# Patient Record
Sex: Female | Born: 1985
Health system: Southern US, Community
[De-identification: ages and names within clinical notes are randomized; demographics above are authoritative.]

---
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
## 2011-01-23 ENCOUNTER — Emergency Department (HOSPITAL_BASED_OUTPATIENT_CLINIC_OR_DEPARTMENT_OTHER)
Admission: EM | Admit: 2011-01-23 | Discharge: 2011-01-23 | Disposition: A | Discharge status: Home / Self Care | Payer: No Typology Code available for payment source | Attending: Emergency Medicine | Admitting: Emergency Medicine

## 2011-01-23 ENCOUNTER — Encounter: Payer: Self-pay | Admitting: *Deleted

## 2011-01-23 ENCOUNTER — Emergency Department (INDEPENDENT_AMBULATORY_CARE_PROVIDER_SITE_OTHER): Payer: No Typology Code available for payment source

## 2011-01-23 DIAGNOSIS — S339XXA Sprain of unspecified parts of lumbar spine and pelvis, initial encounter: Secondary | ICD-10-CM | POA: Insufficient documentation

## 2011-01-23 DIAGNOSIS — M79609 Pain in unspecified limb: Secondary | ICD-10-CM

## 2011-01-23 DIAGNOSIS — S39012A Strain of muscle, fascia and tendon of lower back, initial encounter: Secondary | ICD-10-CM

## 2011-01-23 DIAGNOSIS — Y92410 Unspecified street and highway as the place of occurrence of the external cause: Secondary | ICD-10-CM | POA: Insufficient documentation

## 2011-01-23 DIAGNOSIS — S62639A Displaced fracture of distal phalanx of unspecified finger, initial encounter for closed fracture: Secondary | ICD-10-CM

## 2011-01-23 DIAGNOSIS — S62609A Fracture of unspecified phalanx of unspecified finger, initial encounter for closed fracture: Secondary | ICD-10-CM | POA: Insufficient documentation

## 2011-01-23 DIAGNOSIS — S62600A Fracture of unspecified phalanx of right index finger, initial encounter for closed fracture: Secondary | ICD-10-CM

## 2011-01-23 LAB — PREGNANCY, URINE: Preg Test, Ur: NEGATIVE

## 2011-01-23 MED ORDER — IBUPROFEN 800 MG PO TABS
800.0000 mg | ORAL_TABLET | Freq: Once | ORAL | Status: AC
  Administered 2011-01-23: 800 mg via ORAL
  Filled 2011-01-23: qty 1

## 2011-01-23 MED ORDER — HYDROCODONE-ACETAMINOPHEN 5-500 MG PO TABS: 1.0000 | ORAL_TABLET | Freq: Four times a day (QID) | ORAL | Status: AC | PRN

## 2011-01-23 MED ORDER — CYCLOBENZAPRINE HCL 10 MG PO TABS: 10.0000 mg | ORAL_TABLET | Freq: Two times a day (BID) | ORAL | Status: AC | PRN

## 2011-01-23 MED ORDER — NAPROXEN 500 MG PO TABS: 500.0000 mg | ORAL_TABLET | Freq: Two times a day (BID) | ORAL | Status: AC
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
## 2017-06-17 ENCOUNTER — Emergency Department (HOSPITAL_BASED_OUTPATIENT_CLINIC_OR_DEPARTMENT_OTHER)
Admission: EM | Admit: 2017-06-17 | Discharge: 2017-06-17 | Disposition: A | Discharge status: Home / Self Care | Payer: Self-pay | Attending: Emergency Medicine | Admitting: Emergency Medicine

## 2017-06-17 ENCOUNTER — Encounter (HOSPITAL_BASED_OUTPATIENT_CLINIC_OR_DEPARTMENT_OTHER): Payer: Self-pay | Admitting: *Deleted

## 2017-06-17 ENCOUNTER — Other Ambulatory Visit: Payer: Self-pay

## 2017-06-17 ENCOUNTER — Emergency Department (HOSPITAL_BASED_OUTPATIENT_CLINIC_OR_DEPARTMENT_OTHER): Payer: Self-pay

## 2017-06-17 DIAGNOSIS — R69 Illness, unspecified: Secondary | ICD-10-CM

## 2017-06-17 DIAGNOSIS — J111 Influenza due to unidentified influenza virus with other respiratory manifestations: Secondary | ICD-10-CM

## 2017-06-17 DIAGNOSIS — F17210 Nicotine dependence, cigarettes, uncomplicated: Secondary | ICD-10-CM | POA: Insufficient documentation

## 2017-06-17 MED ORDER — ONDANSETRON HCL 4 MG PO TABS: 4.0000 mg | ORAL_TABLET | Freq: Four times a day (QID) | ORAL | 0 refills | Status: AC

## 2017-06-17 MED ORDER — BENZONATATE 100 MG PO CAPS: 100.0000 mg | ORAL_CAPSULE | Freq: Three times a day (TID) | ORAL | 0 refills | Status: AC

## 2017-06-17 MED ORDER — ACETAMINOPHEN 500 MG PO TABS: 500.0000 mg | ORAL_TABLET | Freq: Four times a day (QID) | ORAL | 0 refills | Status: AC | PRN

## 2017-06-17 MED ORDER — CETIRIZINE-PSEUDOEPHEDRINE ER 5-120 MG PO TB12: 1.0000 | ORAL_TABLET | Freq: Two times a day (BID) | ORAL | 0 refills | Status: AC

## 2017-06-17 MED ORDER — IBUPROFEN 600 MG PO TABS: 600.0000 mg | ORAL_TABLET | Freq: Four times a day (QID) | ORAL | 0 refills | Status: AC | PRN

## 2017-06-17 MED FILL — BENZONATATE 100 MG CAPSULE: 100 | 7 days supply | Qty: 21 | Fill #0

## 2017-06-17 MED FILL — ACETAMINOPHEN EXTRA STRENGT: 500 | 25 days supply | Qty: 100 | Fill #0

## 2017-06-17 NOTE — ED Triage Notes (Signed)
Pt reports 2 days of cough and congestion with temp up to 101 yesterday, body aches and chills.

## 2017-06-17 NOTE — ED Provider Notes (Signed)
[facility] EMERGENCY DEPARTMENT Provider Note   CSN: [id number] Arrival date & time: 06/17/17  4098     History   Chief Complaint Chief Complaint  Patient presents with  . Cough    HPI Lisa Montgomery is a 32 y.o. female who presents with a 2-day history of fever, cough, congestion, and one episode of vomiting.  Patient has had associated sore throat as well.  Patient symptoms began when she got off work 36 hours ago.  She denies any abdominal pain or diarrhea.  She has had fever up to 102 at home.  She has not taken any medications at home prior to arrival.  She has had associated body aches.  She reports her daughter just got over a similar illness and had a fever.  HPI  History reviewed. No pertinent past medical history.  There are no active problems to display for this patient.   Past Surgical History:  Procedure Laterality Date  . CESAREAN SECTION      OB History    No data available       Home Medications    Prior to Admission medications   Medication Sig Start Date End Date Taking? Authorizing Provider  acetaminophen (TYLENOL) 500 MG tablet Take 1 tablet (500 mg total) by mouth every 6 (six) hours as needed. 06/17/17   Robi Dewolfe, Waylan Boga, PA-C  benzonatate (TESSALON) 100 MG capsule Take 1 capsule (100 mg total) by mouth every 8 (eight) hours. 06/17/17   Preslea Rhodus, Waylan Boga, PA-C  cetirizine-pseudoephedrine (ZYRTEC-D) 5-120 MG tablet Take 1 tablet by mouth 2 (two) times daily. 06/17/17   Zyara Riling, Waylan Boga, PA-C  ibuprofen (ADVIL,MOTRIN) 600 MG tablet Take 1 tablet (600 mg total) by mouth every 6 (six) hours as needed. 06/17/17   Leala Bryand, Waylan Boga, PA-C  ondansetron (ZOFRAN) 4 MG tablet Take 1 tablet (4 mg total) by mouth every 6 (six) hours. 06/17/17   Emi Holes, PA-C    Family History History reviewed. No pertinent family history.  Social History Social History   Tobacco Use  . Smoking status: Current Every Day Smoker  . Smokeless tobacco: Never Used    Substance Use Topics  . Alcohol use: Not on file  . Drug use: Not on file     Allergies   Patient has no known allergies.   Review of Systems Review of Systems  Constitutional: Positive for fever.  HENT: Positive for congestion and sore throat. Negative for ear pain.   Respiratory: Positive for cough. Negative for shortness of breath.   Cardiovascular: Negative for chest pain.  Gastrointestinal: Positive for vomiting. Negative for abdominal pain and diarrhea.  Genitourinary: Negative for dysuria.     Physical Exam Updated Vital Signs BP 118/73 (BP Location: Right Arm)   Pulse 78   Temp 99.1 F (37.3 C) (Oral)   Resp 18   Ht 5\' 4"  (1.626 m)   Wt 74.8 kg (165 lb)   LMP 05/19/2017   SpO2 100%   BMI 28.32 kg/m   Physical Exam  Constitutional: She appears well-developed and well-nourished. No distress.  HENT:  Head: Normocephalic and atraumatic.  Right Ear: Tympanic membrane normal.  Left Ear: Tympanic membrane normal.  Mouth/Throat: Posterior oropharyngeal erythema present. No oropharyngeal exudate, posterior oropharyngeal edema or tonsillar abscesses. Tonsils are 1+ on the right. Tonsils are 1+ on the left. No tonsillar exudate.  Eyes: Conjunctivae are normal. Pupils are equal, round, and reactive to light. Right eye exhibits no discharge. Left eye  exhibits no discharge. No scleral icterus.  Neck: Normal range of motion. Neck supple. No thyromegaly present.  Cardiovascular: Normal rate, regular rhythm, normal heart sounds and intact distal pulses. Exam reveals no gallop and no friction rub.  No murmur heard. Pulmonary/Chest: Effort normal and breath sounds normal. No stridor. No respiratory distress. She has no wheezes. She has no rales.  Rales L base - could be due to smoking  Abdominal: Soft. Bowel sounds are normal. She exhibits no distension. There is no tenderness. There is no rebound and no guarding.  Musculoskeletal: She exhibits no edema.  Lymphadenopathy:     She has no cervical adenopathy.  Neurological: She is alert. Coordination normal.  Skin: Skin is warm and dry. No rash noted. She is not diaphoretic. No pallor.  Psychiatric: She has a normal mood and affect.  Nursing note and vitals reviewed.    ED Treatments / Results  Labs (all labs ordered are listed, but only abnormal results are displayed) Labs Reviewed - No data to display  EKG  EKG Interpretation None       Radiology Dg Chest 2 View  Result Date: 06/17/2017 CLINICAL DATA:  Pt having cough,fever and congestion for 2 days,smoker EXAM: CHEST  2 VIEW COMPARISON:  None. FINDINGS: The heart size and mediastinal contours are within normal limits. Both lungs are clear. No pleural effusion or pneumothorax. The visualized skeletal structures are unremarkable. IMPRESSION: Normal chest radiographs. Electronically Signed   By: Amie Portlandavid  Ormond M.D.   On: 06/17/2017 11:43    Procedures Procedures (including critical care time)  Medications Ordered in ED Medications - No data to display   Initial Impression / Assessment and Plan / ED Course  I have reviewed the triage vital signs and the nursing notes.  Pertinent labs & imaging results that were available during my care of the patient were reviewed by me and considered in my medical decision making (see chart for details).     Patient with symptoms consistent with influenza or similar virus.  Vitals are stable, low-grade fever.  No signs of dehydration, tolerating PO's.  CXR is negative. Discussed the cost versus benefit of Tamiflu treatment with the patient and she declines. Patient will be discharged with instructions to orally hydrate, rest, and use over-the-counter medications such as anti-inflammatories ibuprofen and Aleve for muscle aches and Tylenol for fever.  Patient will also be given a cough suppressant.  Return precautions discussed.  Patient understands and agrees with plan.  Patient vitals stable and discharged in  satisfactory condition.   Final Clinical Impressions(s) / ED Diagnoses   Final diagnoses:  Influenza-like illness    ED Discharge Orders        Ordered    ibuprofen (ADVIL,MOTRIN) 600 MG tablet  Every 6 hours PRN     06/17/17 1207    acetaminophen (TYLENOL) 500 MG tablet  Every 6 hours PRN     06/17/17 1207    benzonatate (TESSALON) 100 MG capsule  Every 8 hours     06/17/17 1207    cetirizine-pseudoephedrine (ZYRTEC-D) 5-120 MG tablet  2 times daily     06/17/17 1207    ondansetron (ZOFRAN) 4 MG tablet  Every 6 hours     06/17/17 1207       Emi HolesLaw, Gust Eugene M, PA-C 06/17/17 1209    Loren RacerYelverton, David, MD 06/17/17 1213

## 2017-06-17 NOTE — ED Notes (Signed)
ED Provider at bedside. 

## 2017-06-17 NOTE — Discharge Instructions (Signed)
Medications: Ibuprofen, Tylenol, Tessalon, Zyrtec-D, Zofran  Treatment: You can alternate ibuprofen and Tylenol every 3 hours for take one kind every 6 hours as needed for body aches or fever.  Take Tessalon every 8 hours as needed for cough.  Take Zyrtec-D 1-2 times daily for nasal congestion.  Take Zofran every 6 hours as needed for nausea or vomiting.  Make sure to get plenty of rest and drink plenty of fluids.  Follow-up: Please return to the emergency department if you develop any new or worsening symptoms including chest pain, shortness of breath, intractable vomiting, severe, localizing abdominal pain, passing out, or feeling like you might pass out.

## 2019-03-24 IMAGING — CR DG CHEST 2V
2 series · 2 of 2 positions shown · non-contrast
Comparison: None.

CLINICAL DATA: Pt having cough,fever and congestion for 2
days,smoker

EXAM:
CHEST  2 VIEW

[w chest pa]
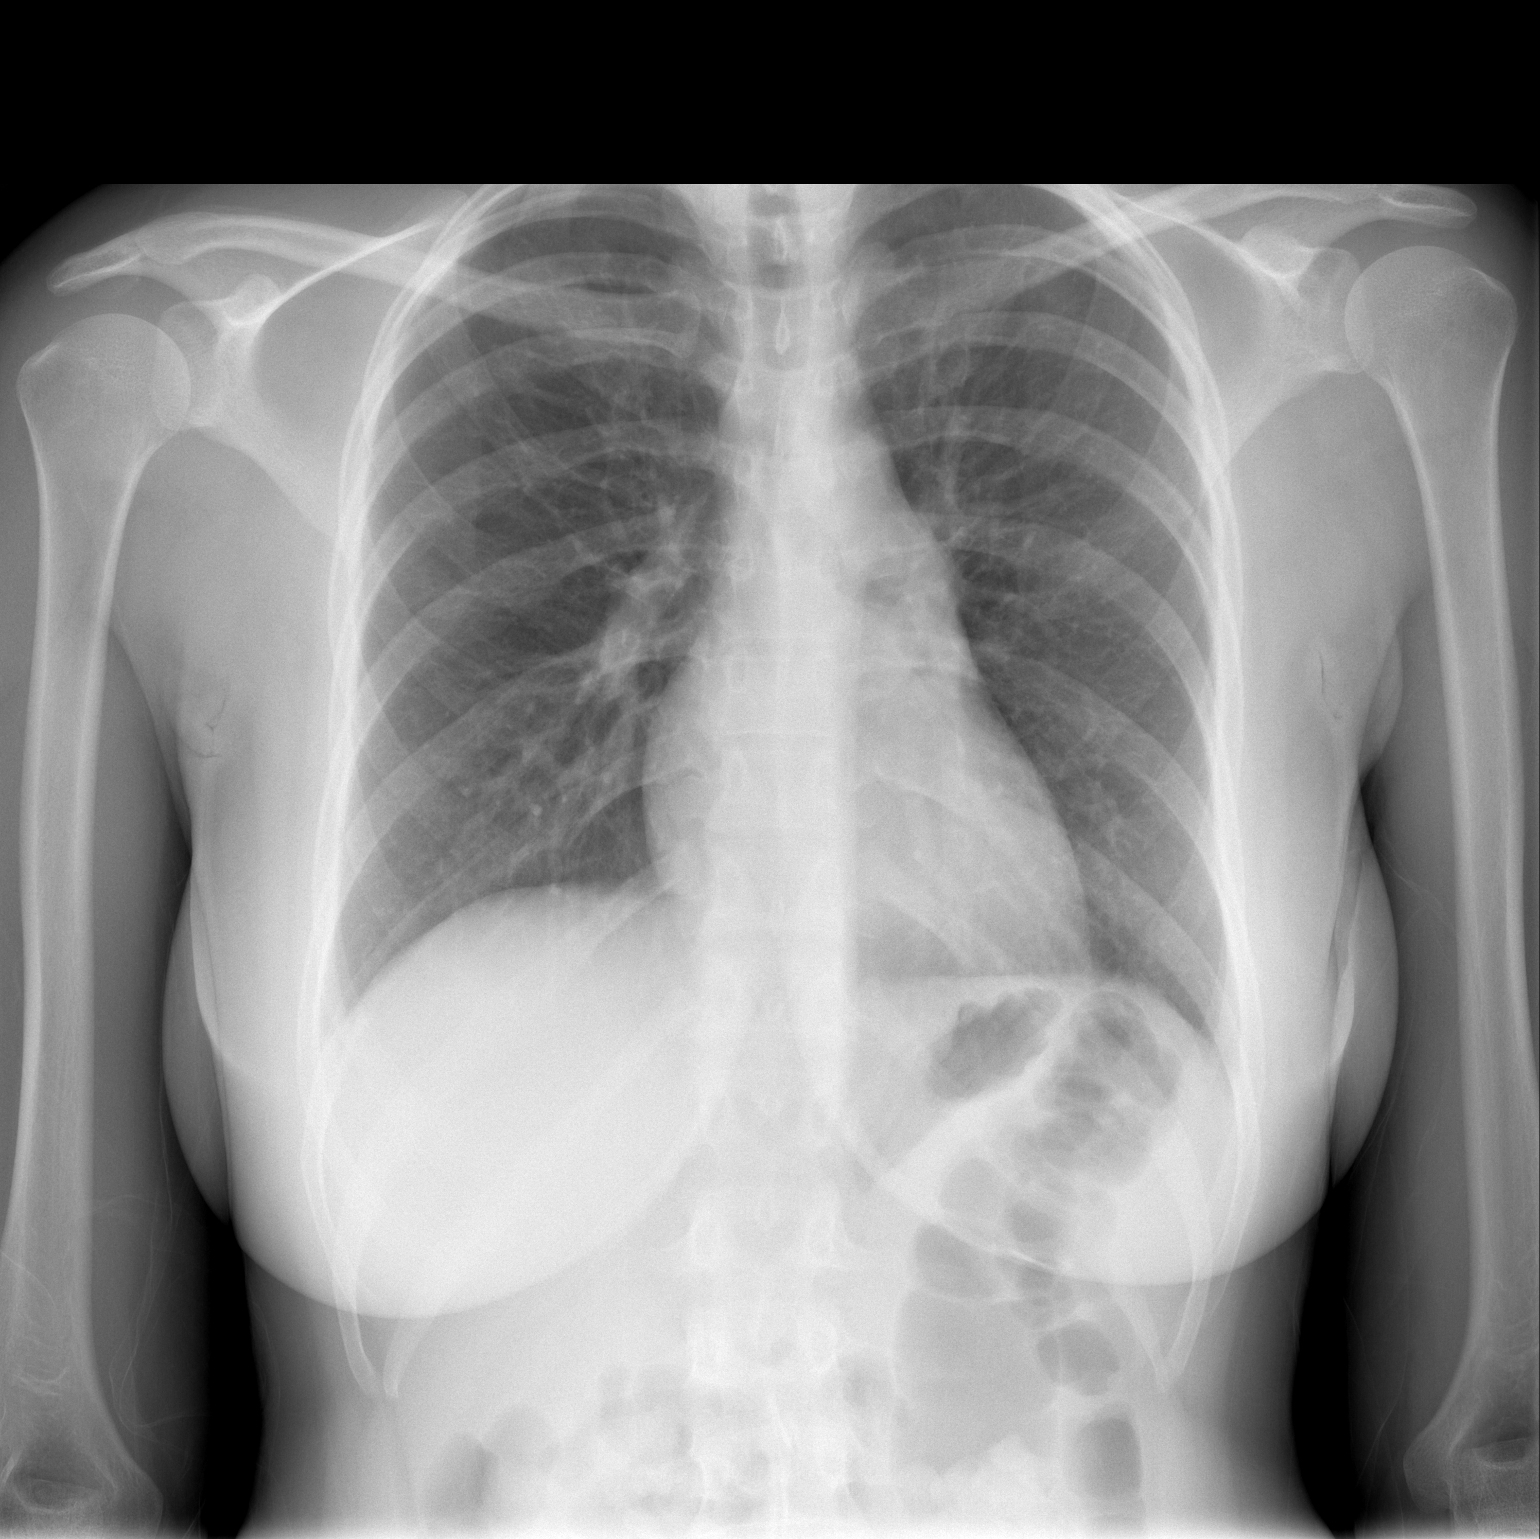

[w chest lat]
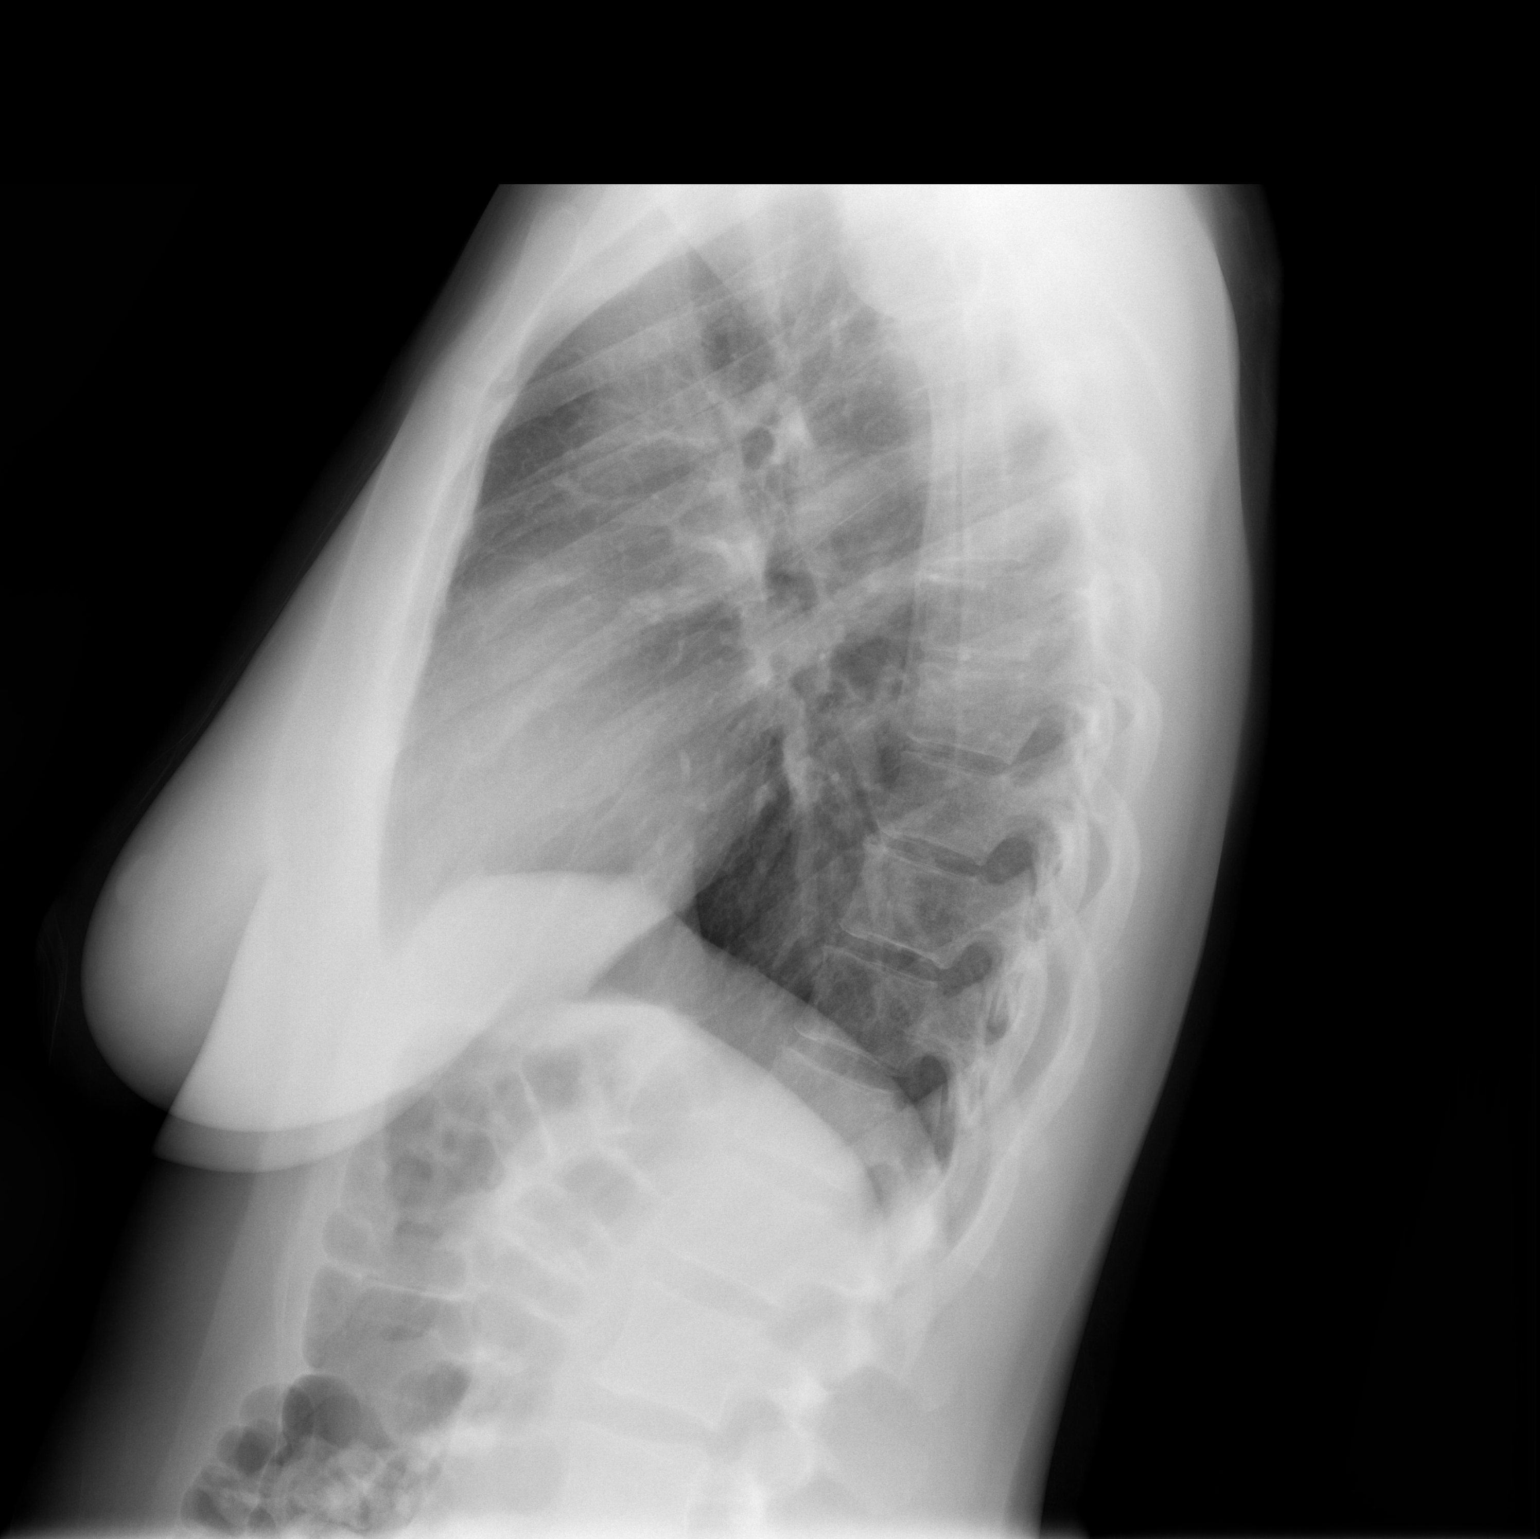

[2 of 2 positions shown; findings below may reference images not displayed]

FINDINGS: The heart size and mediastinal contours are within normal limits.
Both lungs are clear. No pleural effusion or pneumothorax. The
visualized skeletal structures are unremarkable.
IMPRESSION: Normal chest radiographs.
